# Patient Record
Sex: Male | Born: 1965 | Race: Black or African American | Hispanic: No | Marital: Married | Smoking: Never smoker
Health system: Southern US, Community
[De-identification: ages and names within clinical notes are randomized; demographics above are authoritative.]

## PROBLEM LIST (undated history)

## (undated) DIAGNOSIS — I10 Essential (primary) hypertension: Secondary | ICD-10-CM

## (undated) HISTORY — PX: BRAIN SURGERY: SHX531

---

## 2008-02-10 ENCOUNTER — Emergency Department: Payer: Self-pay | Admitting: Emergency Medicine

## 2009-05-31 ENCOUNTER — Ambulatory Visit: Payer: Self-pay | Admitting: *Deleted

## 2010-04-11 ENCOUNTER — Ambulatory Visit: Payer: Self-pay | Admitting: Internal Medicine

## 2010-10-23 ENCOUNTER — Ambulatory Visit: Payer: Self-pay | Admitting: Internal Medicine

## 2013-08-19 ENCOUNTER — Ambulatory Visit: Payer: Self-pay | Admitting: Internal Medicine

## 2015-05-05 ENCOUNTER — Ambulatory Visit
Admission: RE | Admit: 2015-05-05 | Discharge: 2015-05-05 | Disposition: A | Discharge status: Home / Self Care | Payer: BLUE CROSS/BLUE SHIELD | Source: Ambulatory Visit | Attending: Internal Medicine | Admitting: Internal Medicine

## 2015-05-05 ENCOUNTER — Other Ambulatory Visit: Payer: Self-pay | Admitting: Internal Medicine

## 2015-05-05 DIAGNOSIS — R05 Cough: Secondary | ICD-10-CM | POA: Diagnosis present

## 2015-05-05 DIAGNOSIS — R059 Cough, unspecified: Secondary | ICD-10-CM

## 2017-03-27 ENCOUNTER — Encounter: Payer: Self-pay | Admitting: General Surgery

## 2017-04-01 ENCOUNTER — Encounter: Payer: Self-pay | Admitting: *Deleted

## 2017-04-07 ENCOUNTER — Ambulatory Visit: Payer: Self-pay | Admitting: General Surgery

## 2017-04-08 ENCOUNTER — Encounter: Payer: Self-pay | Admitting: *Deleted

## 2020-09-25 DIAGNOSIS — R030 Elevated blood-pressure reading, without diagnosis of hypertension: Secondary | ICD-10-CM | POA: Diagnosis not present

## 2020-09-25 DIAGNOSIS — Z125 Encounter for screening for malignant neoplasm of prostate: Secondary | ICD-10-CM | POA: Diagnosis not present

## 2020-09-25 DIAGNOSIS — Z23 Encounter for immunization: Secondary | ICD-10-CM | POA: Diagnosis not present

## 2020-10-05 DIAGNOSIS — I10 Essential (primary) hypertension: Secondary | ICD-10-CM | POA: Diagnosis not present

## 2020-11-16 DIAGNOSIS — I10 Essential (primary) hypertension: Secondary | ICD-10-CM | POA: Diagnosis not present

## 2020-12-07 DIAGNOSIS — Z20822 Contact with and (suspected) exposure to covid-19: Secondary | ICD-10-CM | POA: Diagnosis not present

## 2021-02-26 DIAGNOSIS — M9903 Segmental and somatic dysfunction of lumbar region: Secondary | ICD-10-CM | POA: Diagnosis not present

## 2021-02-26 DIAGNOSIS — M5431 Sciatica, right side: Secondary | ICD-10-CM | POA: Diagnosis not present

## 2021-02-26 DIAGNOSIS — M9905 Segmental and somatic dysfunction of pelvic region: Secondary | ICD-10-CM | POA: Diagnosis not present

## 2021-02-26 DIAGNOSIS — M9904 Segmental and somatic dysfunction of sacral region: Secondary | ICD-10-CM | POA: Diagnosis not present

## 2021-02-28 DIAGNOSIS — M25472 Effusion, left ankle: Secondary | ICD-10-CM | POA: Diagnosis not present

## 2021-02-28 DIAGNOSIS — M9904 Segmental and somatic dysfunction of sacral region: Secondary | ICD-10-CM | POA: Diagnosis not present

## 2021-02-28 DIAGNOSIS — M9903 Segmental and somatic dysfunction of lumbar region: Secondary | ICD-10-CM | POA: Diagnosis not present

## 2021-02-28 DIAGNOSIS — M5431 Sciatica, right side: Secondary | ICD-10-CM | POA: Diagnosis not present

## 2021-03-05 DIAGNOSIS — M25472 Effusion, left ankle: Secondary | ICD-10-CM | POA: Diagnosis not present

## 2021-03-05 DIAGNOSIS — M9903 Segmental and somatic dysfunction of lumbar region: Secondary | ICD-10-CM | POA: Diagnosis not present

## 2021-03-05 DIAGNOSIS — M9904 Segmental and somatic dysfunction of sacral region: Secondary | ICD-10-CM | POA: Diagnosis not present

## 2021-03-05 DIAGNOSIS — M5431 Sciatica, right side: Secondary | ICD-10-CM | POA: Diagnosis not present

## 2021-03-07 DIAGNOSIS — M9904 Segmental and somatic dysfunction of sacral region: Secondary | ICD-10-CM | POA: Diagnosis not present

## 2021-03-07 DIAGNOSIS — M9903 Segmental and somatic dysfunction of lumbar region: Secondary | ICD-10-CM | POA: Diagnosis not present

## 2021-03-07 DIAGNOSIS — M25472 Effusion, left ankle: Secondary | ICD-10-CM | POA: Diagnosis not present

## 2021-03-07 DIAGNOSIS — M5431 Sciatica, right side: Secondary | ICD-10-CM | POA: Diagnosis not present

## 2021-03-14 DIAGNOSIS — M9904 Segmental and somatic dysfunction of sacral region: Secondary | ICD-10-CM | POA: Diagnosis not present

## 2021-03-14 DIAGNOSIS — M5431 Sciatica, right side: Secondary | ICD-10-CM | POA: Diagnosis not present

## 2021-03-14 DIAGNOSIS — M9903 Segmental and somatic dysfunction of lumbar region: Secondary | ICD-10-CM | POA: Diagnosis not present

## 2021-03-14 DIAGNOSIS — M25472 Effusion, left ankle: Secondary | ICD-10-CM | POA: Diagnosis not present

## 2021-03-21 DIAGNOSIS — M545 Low back pain, unspecified: Secondary | ICD-10-CM | POA: Diagnosis not present

## 2021-06-30 ENCOUNTER — Emergency Department
Admission: EM | Admit: 2021-06-30 | Discharge: 2021-07-01 | Disposition: A | Discharge status: Home / Self Care | Payer: BC Managed Care – PPO | Attending: Emergency Medicine | Admitting: Emergency Medicine

## 2021-06-30 ENCOUNTER — Other Ambulatory Visit: Payer: Self-pay

## 2021-06-30 ENCOUNTER — Encounter: Payer: Self-pay | Admitting: Emergency Medicine

## 2021-06-30 ENCOUNTER — Emergency Department: Payer: BC Managed Care – PPO

## 2021-06-30 DIAGNOSIS — R059 Cough, unspecified: Secondary | ICD-10-CM | POA: Diagnosis not present

## 2021-06-30 DIAGNOSIS — J209 Acute bronchitis, unspecified: Secondary | ICD-10-CM | POA: Insufficient documentation

## 2021-06-30 DIAGNOSIS — R0789 Other chest pain: Secondary | ICD-10-CM | POA: Insufficient documentation

## 2021-06-30 DIAGNOSIS — R062 Wheezing: Secondary | ICD-10-CM | POA: Diagnosis not present

## 2021-06-30 DIAGNOSIS — R0602 Shortness of breath: Secondary | ICD-10-CM | POA: Diagnosis not present

## 2021-06-30 DIAGNOSIS — Z87891 Personal history of nicotine dependence: Secondary | ICD-10-CM | POA: Insufficient documentation

## 2021-06-30 DIAGNOSIS — I10 Essential (primary) hypertension: Secondary | ICD-10-CM | POA: Diagnosis not present

## 2021-06-30 HISTORY — DX: Essential (primary) hypertension: I10

## 2021-06-30 LAB — BASIC METABOLIC PANEL
Anion gap: 7 (ref 5–15)
BUN: 13 mg/dL (ref 6–20)
CO2: 28 mmol/L (ref 22–32)
Calcium: 9.6 mg/dL (ref 8.9–10.3)
Chloride: 105 mmol/L (ref 98–111)
Creatinine, Ser: 1.43 mg/dL — ABNORMAL HIGH (ref 0.61–1.24)
GFR, Estimated: 58 mL/min — ABNORMAL LOW (ref >60)
Glucose, Bld: 100 mg/dL — ABNORMAL HIGH (ref 70–99)
Potassium: 3.5 mmol/L (ref 3.5–5.1)
Sodium: 140 mmol/L (ref 135–145)

## 2021-06-30 LAB — TROPONIN I (HIGH SENSITIVITY)
Troponin I (High Sensitivity): 5 ng/L (ref <18)
Troponin I (High Sensitivity): 5 ng/L (ref <18)

## 2021-06-30 LAB — CBC
HCT: 40.8 % (ref 39.0–52.0)
Hemoglobin: 14 g/dL (ref 13.0–17.0)
MCH: 27.8 pg (ref 26.0–34.0)
MCHC: 34.3 g/dL (ref 30.0–36.0)
MCV: 81 fL (ref 80.0–100.0)
Platelets: 228 10*3/uL (ref 150–400)
RBC: 5.04 MIL/uL (ref 4.22–5.81)
RDW: 13.1 % (ref 11.5–15.5)
WBC: 3.9 10*3/uL — ABNORMAL LOW (ref 4.0–10.5)
nRBC: 0 % (ref 0.0–0.2)

## 2021-06-30 NOTE — ED Triage Notes (Signed)
Pt via POV from home. Pt c/o chest tightness, SOB, and productive cough for 2 weaks. Pt has been exposed to COVID. Pt is states "when I cough or take a deep breath, everything hurts" Pt is A&Ox4 and NAD.

## 2021-07-01 LAB — HEPATIC FUNCTION PANEL
ALT: 32 U/L (ref 0–44)
AST: 26 U/L (ref 15–41)
Albumin: 4.5 g/dL (ref 3.5–5.0)
Alkaline Phosphatase: 49 U/L (ref 38–126)
Bilirubin, Direct: <0.1 mg/dL (ref 0.0–0.2)
Total Bilirubin: 0.8 mg/dL (ref 0.3–1.2)
Total Protein: 7.3 g/dL (ref 6.5–8.1)

## 2021-07-01 LAB — LIPASE, BLOOD: Lipase: 50 U/L (ref 11–51)

## 2021-07-01 MED ORDER — PREDNISONE 20 MG PO TABS: 40.0000 mg | ORAL_TABLET | Freq: Every day | ORAL | 0 refills | Status: AC

## 2021-07-01 MED ORDER — NAPROXEN 500 MG PO TABS
500.0000 mg | ORAL_TABLET | Freq: Once | ORAL | Status: AC
  Administered 2021-07-01: 500 mg via ORAL
  Filled 2021-07-01: qty 1

## 2021-07-01 MED ORDER — FAMOTIDINE 20 MG PO TABS: 20.0000 mg | ORAL_TABLET | Freq: Two times a day (BID) | ORAL | 0 refills | Status: DC

## 2021-07-01 MED ORDER — IPRATROPIUM-ALBUTEROL 0.5-2.5 (3) MG/3ML IN SOLN
3.0000 mL | Freq: Once | RESPIRATORY_TRACT | Status: AC
  Administered 2021-07-01: 3 mL via RESPIRATORY_TRACT
  Filled 2021-07-01: qty 3

## 2021-07-01 MED ORDER — PREDNISONE 20 MG PO TABS
60.0000 mg | ORAL_TABLET | Freq: Once | ORAL | Status: AC
  Administered 2021-07-01: 60 mg via ORAL
  Filled 2021-07-01: qty 3

## 2021-07-01 MED ORDER — NAPROXEN 500 MG PO TABS: 500.0000 mg | ORAL_TABLET | Freq: Two times a day (BID) | ORAL | 0 refills | Status: DC

## 2021-07-01 MED ORDER — FAMOTIDINE 20 MG PO TABS
40.0000 mg | ORAL_TABLET | Freq: Once | ORAL | Status: AC
  Administered 2021-07-01: 40 mg via ORAL
  Filled 2021-07-01: qty 2

## 2021-07-01 MED ORDER — ALBUTEROL SULFATE HFA 108 (90 BASE) MCG/ACT IN AERS: 2.0000 | INHALATION_SPRAY | RESPIRATORY_TRACT | 0 refills | Status: DC | PRN

## 2021-07-01 NOTE — ED Provider Notes (Signed)
[facility] Emergency Department Provider Note  ____________________________________________  Time seen: Approximately 2:17 AM  I have reviewed the triage vital signs and the nursing notes.   HISTORY  Chief Complaint Chest Pain and Shortness of Breath    HPI DARTANYON FRANKOWSKI is a 55 y.o. male with a history of hypertension who comes ED complaining of wheezing cough and chest tightness for the past week.  Patient reports that over the last 2 weeks has been having multiple symptoms of viral illness including chills, body aches, fatigue.  More recently started having  nonproductive cough, wheezing, chest tightness with movement and coughing.  He notes multiple of his coworkers recently have had COVID.  Chest tightness is intermittent.  Not pleuritic.  No vomiting or diarrhea.  Tolerating oral intake.  Symptoms are waxing waning, no aggravating or alleviating factors.  Mild to moderate intensity.  Comes the ED tonight due to feeling like symptoms have  just gone on too long    Past Medical History:  Diagnosis Date   Hypertension      There are no problems to display for this patient.       Prior to Admission medications   Medication Sig Start Date End Date Taking? Authorizing Provider  albuterol (PROVENTIL HFA) 108 (90 Base) MCG/ACT inhaler Inhale 2 puffs into the lungs every 4 (four) hours as needed for wheezing or shortness of breath. 07/01/21  Yes Sharman Cheek, MD  famotidine (PEPCID) 20 MG tablet Take 1 tablet (20 mg total) by mouth 2 (two) times daily. 07/01/21  Yes Sharman Cheek, MD  naproxen (NAPROSYN) 500 MG tablet Take 1 tablet (500 mg total) by mouth 2 (two) times daily with a meal. 07/01/21  Yes Sharman Cheek, MD  predniSONE (DELTASONE) 20 MG tablet Take 2 tablets (40 mg total) by mouth daily with breakfast for 4 days. 07/01/21 07/05/21 Yes Sharman Cheek, MD     Allergies Hydrocodone   No family history on file.  Social  History Social History   Tobacco Use   Smoking status: Former    Types: Cigarettes   Smokeless tobacco: Never  Substance Use Topics   Alcohol use: Yes   Drug use: Never    Review of Systems  Constitutional:   No fever positive chills.  ENT:   Positive sore throat. No rhinorrhea. Cardiovascular:   Positive chest tightness without palpitations or syncope. Respiratory:   No dyspnea positive nonproductive cough. Gastrointestinal:   Negative for abdominal pain, vomiting and diarrhea.  Musculoskeletal:   Negative for focal pain or swelling All other systems reviewed and are negative except as documented above in ROS and HPI.  ____________________________________________   PHYSICAL EXAM:  VITAL SIGNS: ED Triage Vitals [06/30/21 1833]  Enc Vitals Group     BP 138/84     Pulse Rate 78     Resp 20     Temp 98.3 F (36.8 C)     Temp Source Oral     SpO2 100 %     Weight 192 lb (87.1 kg)     Height 5\' 7"  (1.702 m)     Head Circumference      Peak Flow      Pain Score 8     Pain Loc      Pain Edu?      Excl. in GC?     Vital signs reviewed, nursing assessments reviewed.   Constitutional:   Alert and oriented. Non-toxic appearance. Eyes:   Conjunctivae are normal. EOMI.  PERRL. ENT      Head:   Normocephalic and atraumatic.      Nose:   Normal.      Mouth/Throat:   Normal, moist mucosa.      Neck:   No meningismus. Full ROM. Hematological/Lymphatic/Immunilogical:   No cervical lymphadenopathy. Cardiovascular:   RRR. Symmetric bilateral radial and DP pulses.  No murmurs. Cap refill less than 2 seconds. Respiratory:   Normal respiratory effort without tachypnea/retractions.  Diffuse expiratory wheezing.  Prolonged expiratory phase with coughing. Gastrointestinal:   Soft and nontender. Non distended. There is no CVA tenderness.  No rebound, rigidity, or guarding. Genitourinary:   deferred Musculoskeletal:   Normal range of motion in all extremities. No joint effusions.  No  lower extremity tenderness.  No edema. Neurologic:   Normal speech and language.  Motor grossly intact. No acute focal neurologic deficits are appreciated.  Skin:    Skin is warm, dry and intact. No rash noted.  No petechiae, purpura, or bullae.  ____________________________________________    LABS (pertinent positives/negatives) (all labs ordered are listed, but only abnormal results are displayed) Labs Reviewed  BASIC METABOLIC PANEL - Abnormal; Notable for the following components:      Result Value   Glucose, Bld 100 (*)    Creatinine, Ser 1.43 (*)    GFR, Estimated 58 (*)    All other components within normal limits  CBC - Abnormal; Notable for the following components:   WBC 3.9 (*)    All other components within normal limits  LIPASE, BLOOD  HEPATIC FUNCTION PANEL  TROPONIN I (HIGH SENSITIVITY)  TROPONIN I (HIGH SENSITIVITY)   ____________________________________________   EKG Interpreted by me Normal sinus rhythm rate of 69, normal axis and intervals.  Poor R wave progression.  Normal ST segments.  Isolated T wave inversion in lead III which is nonspecific.  No evidence of right heart strain.   ____________________________________________    RADIOLOGY  DG Chest 2 View  Result Date: 06/30/2021 CLINICAL DATA:  Chest tightness, short of breath, productive cough for 2 weeks EXAM: CHEST - 2 VIEW COMPARISON:  04/25/2015 FINDINGS: Frontal and lateral views of the chest demonstrate an unremarkable cardiac silhouette. No acute airspace disease, effusion, or pneumothorax. No acute bony abnormalities. IMPRESSION: 1. No acute intrathoracic process. Electronically Signed   By: Sharlet Salina M.D.   On: 06/30/2021 18:57    ____________________________________________   PROCEDURES Procedures  ____________________________________________  DIFFERENTIAL DIAGNOSIS   Pneumonia, pneumothorax, acute bronchitis, pleural effusion, pulmonary edema  CLINICAL IMPRESSION /  ASSESSMENT AND PLAN / ED COURSE  Medications ordered in the ED: Medications  predniSONE (DELTASONE) tablet 60 mg (60 mg Oral Given 07/01/21 0019)  ipratropium-albuterol (DUONEB) 0.5-2.5 (3) MG/3ML nebulizer solution 3 mL (3 mLs Nebulization Given 07/01/21 0020)  naproxen (NAPROSYN) tablet 500 mg (500 mg Oral Given 07/01/21 0020)  famotidine (PEPCID) tablet 40 mg (40 mg Oral Given 07/01/21 0020)    Pertinent labs & imaging results that were available during my care of the patient were reviewed by me and considered in my medical decision making (see chart for details).  ANDRES VEST was evaluated in Emergency Department on 07/01/2021 for the symptoms described in the history of present illness. He was evaluated in the context of the global COVID-19 pandemic, which necessitated consideration that the patient might be at risk for infection with the SARS-CoV-2 virus that causes COVID-19. Institutional protocols and algorithms that pertain to the evaluation of patients at risk for COVID-19 are in a  state of rapid change based on information released by regulatory bodies including the CDC and federal and state organizations. These policies and algorithms were followed during the patient's care in the ED.   Patient presents with multiple symptoms of viral illness, now clinically has acute bronchitis. Considering the patient's symptoms, medical history, and physical examination today, I have low suspicion for ACS, PE, TAD, pneumothorax, carditis, mediastinitis, pneumonia, CHF, or sepsis.  Will treat with prednisone, albuterol, Pepcid, naproxen.  At this point with about 14 days of symptoms, COVID/flu testing is not clinically useful since patient would be outside of quarantine.,  Would not benefit from therapeutics.      ____________________________________________   FINAL CLINICAL IMPRESSION(S) / ED DIAGNOSES    Final diagnoses:  Acute bronchitis, unspecified organism     ED Discharge Orders           Ordered    predniSONE (DELTASONE) 20 MG tablet  Daily with breakfast        07/01/21 0217    albuterol (PROVENTIL HFA) 108 (90 Base) MCG/ACT inhaler  Every 4 hours PRN        07/01/21 0217    famotidine (PEPCID) 20 MG tablet  2 times daily        07/01/21 0217    naproxen (NAPROSYN) 500 MG tablet  2 times daily with meals        07/01/21 0217            Portions of this note were generated with dragon dictation software. Dictation errors may occur despite best attempts at proofreading.    Sharman Cheek, MD 07/01/21 Cleophas Dunker

## 2022-02-01 IMAGING — CR DG CHEST 2V
2 series · 2 of 2 positions shown · non-contrast
Comparison: 04/25/2015

CLINICAL DATA: Chest tightness, short of breath, productive cough
for 2 weeks

EXAM:
CHEST - 2 VIEW

[chest pa]
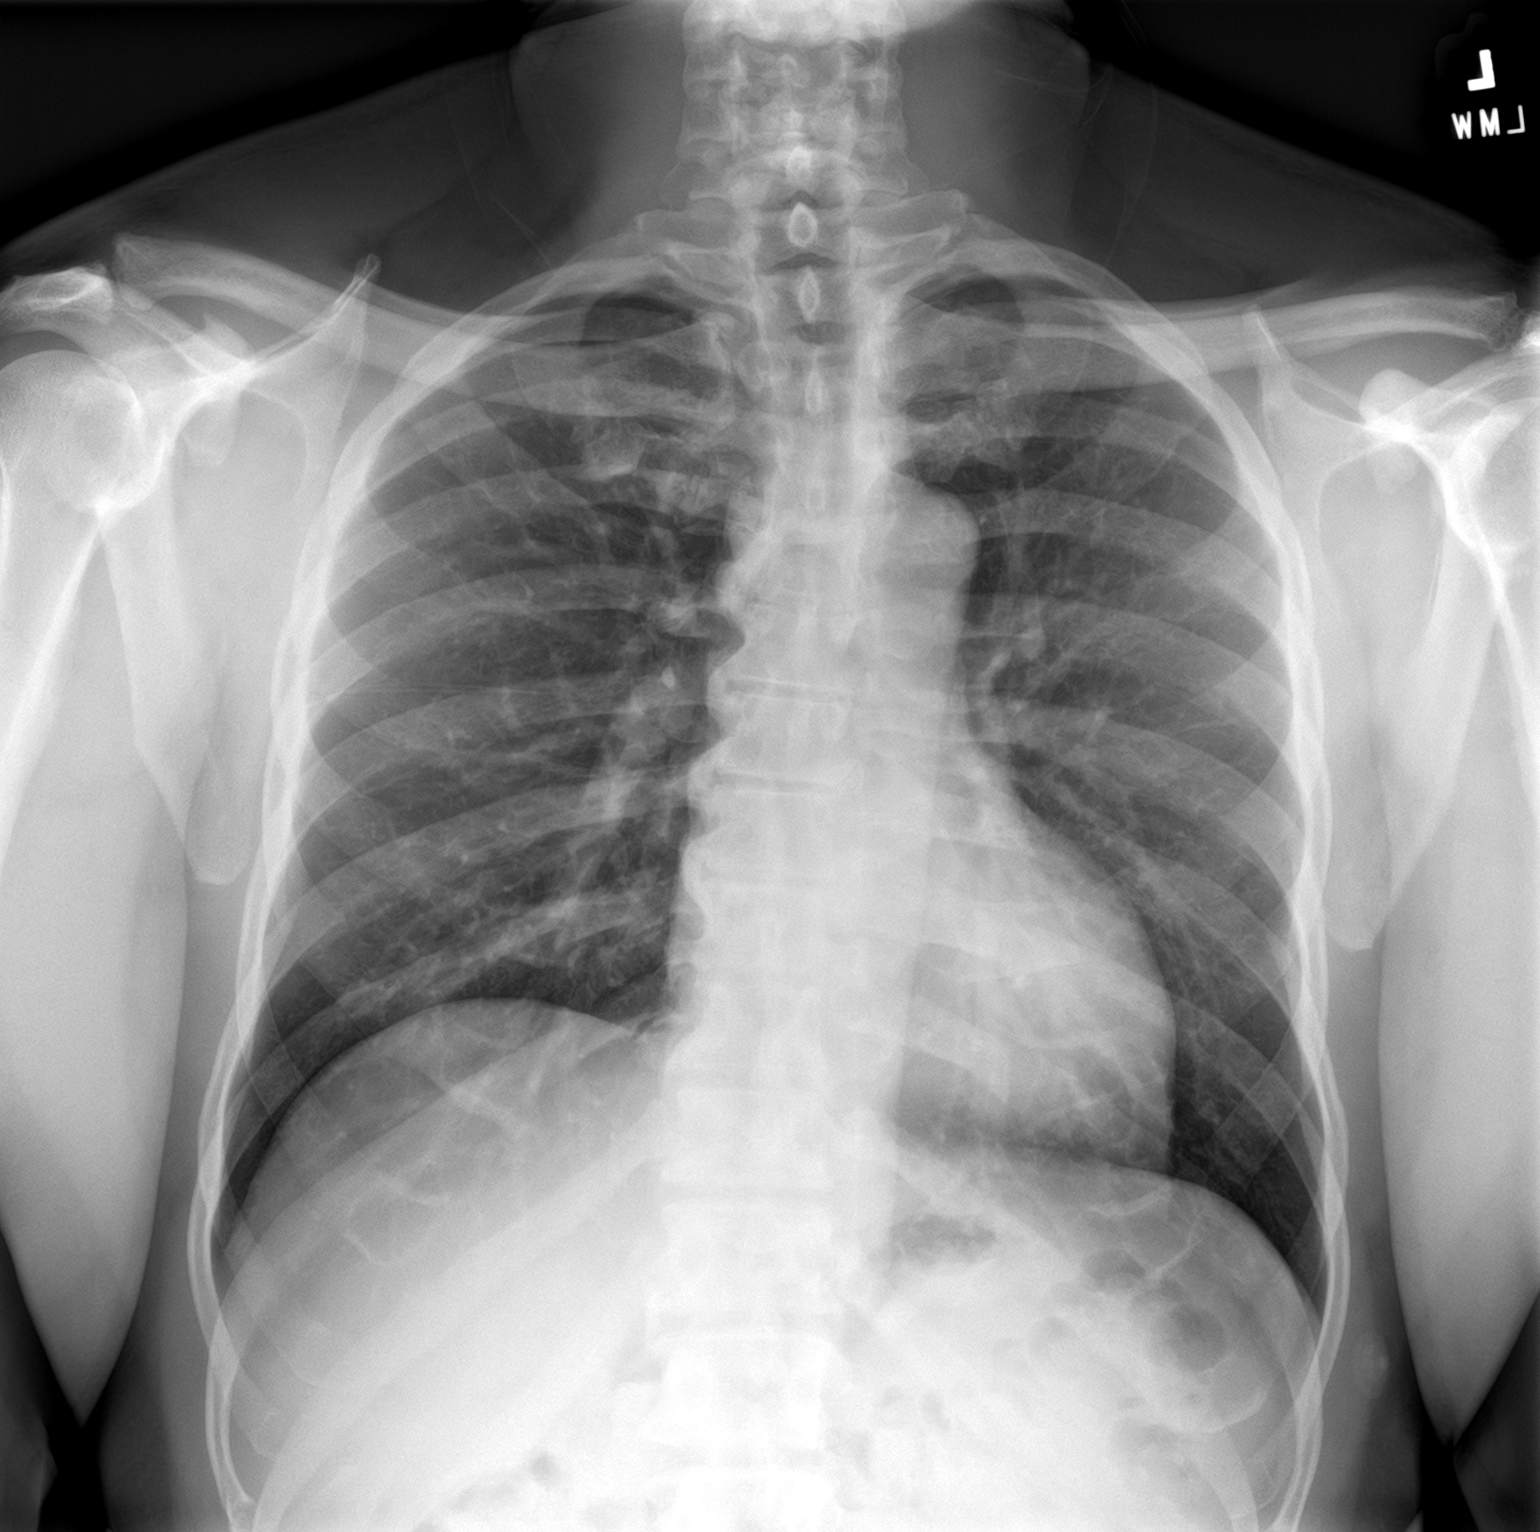

[chest lat]
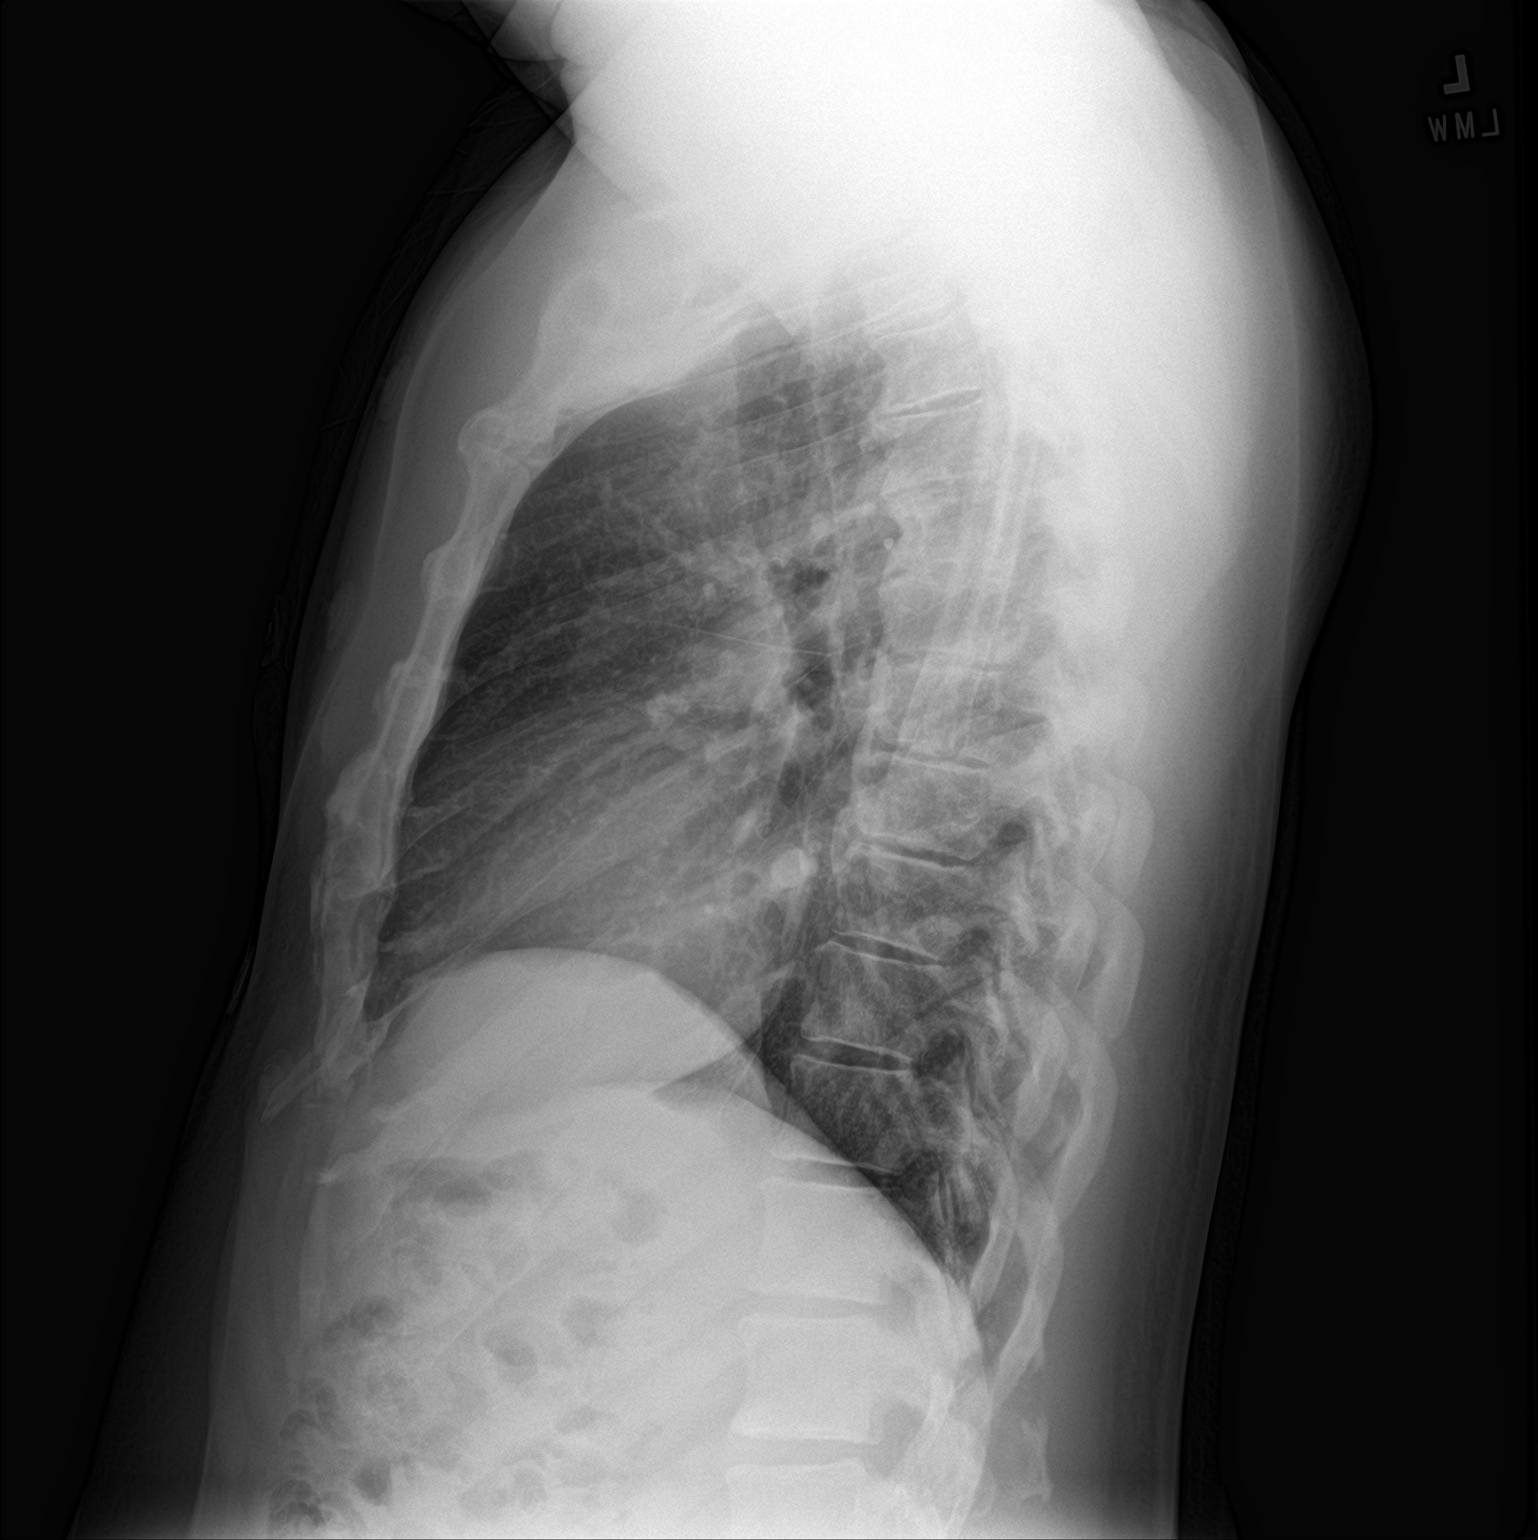

[2 of 2 positions shown; findings below may reference images not displayed]

FINDINGS: Frontal and lateral views of the chest demonstrate an unremarkable
cardiac silhouette. No acute airspace disease, effusion, or
pneumothorax. No acute bony abnormalities.
IMPRESSION: 1. No acute intrathoracic process.

## 2022-07-18 ENCOUNTER — Ambulatory Visit (INDEPENDENT_AMBULATORY_CARE_PROVIDER_SITE_OTHER): Payer: BC Managed Care – PPO | Admitting: Nurse Practitioner

## 2022-07-18 ENCOUNTER — Encounter: Payer: Self-pay | Admitting: Nurse Practitioner

## 2022-07-18 VITALS — BP 152/83 | HR 59 | Ht 67.0 in | Wt 187.8 lb

## 2022-07-18 DIAGNOSIS — R5383 Other fatigue: Secondary | ICD-10-CM | POA: Diagnosis not present

## 2022-07-18 DIAGNOSIS — Z7689 Persons encountering health services in other specified circumstances: Secondary | ICD-10-CM | POA: Diagnosis not present

## 2022-07-18 DIAGNOSIS — I10 Essential (primary) hypertension: Secondary | ICD-10-CM

## 2022-07-18 DIAGNOSIS — E663 Overweight: Secondary | ICD-10-CM | POA: Diagnosis not present

## 2022-07-18 MED ORDER — LISINOPRIL-HYDROCHLOROTHIAZIDE 10-12.5 MG PO TABS: 1.0000 | ORAL_TABLET | Freq: Every day | ORAL | 2 refills | Status: DC

## 2022-07-18 NOTE — Assessment & Plan Note (Signed)
Labs ordered.

## 2022-07-18 NOTE — Assessment & Plan Note (Signed)
Blood pressure 152/83 in the office today. Refilled lisinopril-HCTZ 10-12.5 mg. Advised patient to check blood pressure twice a week and bring the readings during next office visit. If needed would adjust the medication.

## 2022-07-18 NOTE — Progress Notes (Signed)
New Patient Office Visit  Subjective    Patient ID: Shawn Sparks, male    DOB: 25-Sep-1966  Age: 56 y.o. MRN: [id number]  CC:  Chief Complaint  Patient presents with   New Patient (Initial Visit)    Patient here today to establish care and has hx of hypertension. Patient has been w/o med for over a month.     HPI Shawn Sparks presents to establish care.  His previous PCP was Dr. Arlana Pouch at [place]. He has h/o hypertension and was taking lisinopril-HCTZ 10.12.5 mg. He states that he was out of medication and has not take BP medication since a month.    Outpatient Encounter Medications as of 07/18/2022  Medication Sig   [DISCONTINUED] lisinopril-hydrochlorothiazide (ZESTORETIC) 10-12.5 MG tablet Take 1 tablet by mouth daily.   lisinopril-hydrochlorothiazide (ZESTORETIC) 10-12.5 MG tablet Take 1 tablet by mouth daily.   [DISCONTINUED] albuterol (PROVENTIL HFA) 108 (90 Base) MCG/ACT inhaler Inhale 2 puffs into the lungs every 4 (four) hours as needed for wheezing or shortness of breath.   [DISCONTINUED] famotidine (PEPCID) 20 MG tablet Take 1 tablet (20 mg total) by mouth 2 (two) times daily.   [DISCONTINUED] naproxen (NAPROSYN) 500 MG tablet Take 1 tablet (500 mg total) by mouth 2 (two) times daily with a meal.   No facility-administered encounter medications on file as of 07/18/2022.    Past Medical History:  Diagnosis Date   Hypertension     Past Surgical History:  Procedure Laterality Date   BRAIN SURGERY     as infant    Family History  Problem Relation Age of Onset   Hypertension Mother    Hyperlipidemia Mother    Diabetes Mother    Hypertension Father    Hyperlipidemia Father    Diabetes Father     Social History   Socioeconomic History   Marital status: Married    Spouse name: Not on file   Number of children: Not on file   Years of education: Not on file   Highest education level: Not on file  Occupational History   Not on file  Tobacco Use   Smoking  status: Never   Smokeless tobacco: Never  Vaping Use   Vaping Use: Never used  Substance and Sexual Activity   Alcohol use: Yes    Comment: occasionaly   Drug use: Never   Sexual activity: Not Currently  Other Topics Concern   Not on file  Social History Narrative   Not on file   Social Determinants of Health   Financial Resource Strain: Not on file  Food Insecurity: Not on file  Transportation Needs: Not on file  Physical Activity: Not on file  Stress: Not on file  Social Connections: Not on file  Intimate Partner Violence: Not on file    Review of Systems  Constitutional:  Positive for malaise/fatigue. Negative for diaphoresis, fever and weight loss.  HENT:  Negative for ear discharge, hearing loss and sore throat.   Eyes:  Negative for blurred vision, photophobia and redness.  Respiratory:  Negative for cough and shortness of breath.   Cardiovascular:  Negative for chest pain and leg swelling.  Gastrointestinal:  Negative for abdominal pain, blood in stool and heartburn.  Genitourinary:  Negative for dysuria, frequency and hematuria.  Musculoskeletal:  Negative for back pain and myalgias.  Neurological:  Negative for dizziness, tingling and headaches.  Psychiatric/Behavioral:  Negative for depression, substance abuse and suicidal ideas.  Objective    BP (!) 152/83   Pulse (!) 59   Ht 5\' 7"  (1.702 m)   Wt 187 lb 12.8 oz (85.2 kg)   BMI 29.41 kg/m   Physical Exam Constitutional:      Appearance: Normal appearance. He is normal weight.  HENT:     Head: Normocephalic.     Right Ear: Tympanic membrane normal.     Left Ear: Tympanic membrane normal.     Nose: Nose normal.     Mouth/Throat:     Mouth: Mucous membranes are moist.     Pharynx: Oropharynx is clear.  Eyes:     Extraocular Movements: Extraocular movements intact.     Conjunctiva/sclera: Conjunctivae normal.     Pupils: Pupils are equal, round, and reactive to light.  Cardiovascular:      Rate and Rhythm: Normal rate and regular rhythm.     Pulses: Normal pulses.     Heart sounds: Normal heart sounds.  Pulmonary:     Effort: Pulmonary effort is normal.     Breath sounds: Normal breath sounds.  Abdominal:     General: Abdomen is flat. Bowel sounds are normal.     Palpations: Abdomen is soft. There is no mass.     Tenderness: There is no abdominal tenderness.  Musculoskeletal:        General: No swelling or tenderness.     Cervical back: Normal range of motion.  Skin:    General: Skin is warm.     Capillary Refill: Capillary refill takes less than 2 seconds.     Coloration: Skin is not jaundiced.  Neurological:     General: No focal deficit present.     Mental Status: He is alert and oriented to person, place, and time. Mental status is at baseline.  Psychiatric:        Mood and Affect: Mood normal.        Behavior: Behavior normal.        Thought Content: Thought content normal.        Judgment: Judgment normal.         Assessment & Plan:   Problem List Items Addressed This Visit       Cardiovascular and Mediastinum   Hypertension - Primary    Blood pressure 152/83 in the office today. Refilled lisinopril-HCTZ 10-12.5 mg. Advised patient to check blood pressure twice a week and bring the readings during next office visit. If needed would adjust the medication.      Relevant Medications   lisinopril-hydrochlorothiazide (ZESTORETIC) 10-12.5 MG tablet   Other Relevant Orders   COMPLETE METABOLIC PANEL WITH GFR   Lipid panel   PSA   TSH   CBC with Differential/Platelet     Other   Overweight    Body mass index is 29.41 kg/m. Advised patient to lose weight. Encourage patient to consume balanced diet and follow regular exercise routine.      Encounter to establish care    Care established.      Other fatigue    Labs ordered      Relevant Orders   COMPLETE METABOLIC PANEL WITH GFR   Lipid panel   PSA   TSH   CBC with  Differential/Platelet    Return in about 4 weeks (around 08/15/2022) for hypertension.   08/17/2022, NP

## 2022-07-18 NOTE — Assessment & Plan Note (Signed)
Care established. ? ?

## 2022-07-18 NOTE — Assessment & Plan Note (Signed)
Body mass index is 29.41 kg/m. Advised patient to lose weight. Encourage patient to consume balanced diet and follow regular exercise routine.

## 2022-07-19 ENCOUNTER — Ambulatory Visit (INDEPENDENT_AMBULATORY_CARE_PROVIDER_SITE_OTHER): Payer: BC Managed Care – PPO

## 2022-07-19 ENCOUNTER — Other Ambulatory Visit: Payer: Self-pay | Admitting: Nurse Practitioner

## 2022-07-19 DIAGNOSIS — Z0000 Encounter for general adult medical examination without abnormal findings: Secondary | ICD-10-CM | POA: Diagnosis not present

## 2022-07-19 DIAGNOSIS — R5383 Other fatigue: Secondary | ICD-10-CM | POA: Diagnosis not present

## 2022-07-19 DIAGNOSIS — I10 Essential (primary) hypertension: Secondary | ICD-10-CM | POA: Diagnosis not present

## 2022-07-24 LAB — CBC/DIFF AMBIGUOUS DEFAULT
Basophils Absolute: 0 10*3/uL (ref 0.0–0.2)
Basos: 1 %
EOS (ABSOLUTE): 0.3 10*3/uL (ref 0.0–0.4)
Eos: 8 %
Hematocrit: 47.3 % (ref 37.5–51.0)
Hemoglobin: 15.3 g/dL (ref 13.0–17.7)
Immature Grans (Abs): 0 10*3/uL (ref 0.0–0.1)
Immature Granulocytes: 0 %
Lymphocytes Absolute: 1.4 10*3/uL (ref 0.7–3.1)
Lymphs: 39 %
MCH: 27.1 pg (ref 26.6–33.0)
MCHC: 32.3 g/dL (ref 31.5–35.7)
MCV: 84 fL (ref 79–97)
Monocytes Absolute: 0.4 10*3/uL (ref 0.1–0.9)
Monocytes: 11 %
Neutrophils Absolute: 1.4 10*3/uL (ref 1.4–7.0)
Neutrophils: 41 %
Platelets: 182 10*3/uL (ref 150–450)
RBC: 5.65 x10E6/uL (ref 4.14–5.80)
RDW: 13.3 % (ref 11.6–15.4)
WBC: 3.5 10*3/uL (ref 3.4–10.8)

## 2022-07-24 LAB — CMP14+EGFR
ALT: 15 IU/L (ref 0–44)
AST: 17 IU/L (ref 0–40)
Albumin/Globulin Ratio: 1.6 (ref 1.2–2.2)
Albumin: 4.4 g/dL (ref 3.8–4.9)
Alkaline Phosphatase: 58 IU/L (ref 44–121)
BUN/Creatinine Ratio: 8 — ABNORMAL LOW (ref 9–20)
BUN: 10 mg/dL (ref 6–24)
Bilirubin Total: 0.2 mg/dL (ref 0.0–1.2)
CO2: 24 mmol/L (ref 20–29)
Calcium: 9.8 mg/dL (ref 8.7–10.2)
Chloride: 101 mmol/L (ref 96–106)
Creatinine, Ser: 1.33 mg/dL — ABNORMAL HIGH (ref 0.76–1.27)
Globulin, Total: 2.7 g/dL (ref 1.5–4.5)
Glucose: 92 mg/dL (ref 70–99)
Potassium: 4.7 mmol/L (ref 3.5–5.2)
Sodium: 141 mmol/L (ref 134–144)
Total Protein: 7.1 g/dL (ref 6.0–8.5)
eGFR: 63 mL/min/{1.73_m2} (ref >59)

## 2022-07-24 LAB — TSH: TSH: 2.46 u[IU]/mL (ref 0.450–4.500)

## 2022-07-24 LAB — SPECIMEN STATUS REPORT

## 2022-07-24 LAB — LIPID PANEL W/O CHOL/HDL RATIO
Cholesterol, Total: 232 mg/dL — ABNORMAL HIGH (ref 100–199)
HDL: 50 mg/dL (ref >39)
LDL Chol Calc (NIH): 162 mg/dL — ABNORMAL HIGH (ref 0–99)
Triglycerides: 113 mg/dL (ref 0–149)
VLDL Cholesterol Cal: 20 mg/dL (ref 5–40)

## 2022-07-24 LAB — PSA: Prostate Specific Ag, Serum: 2.5 ng/mL (ref 0.0–4.0)

## 2022-08-02 ENCOUNTER — Telehealth: Payer: Self-pay | Admitting: Pharmacist

## 2022-08-02 NOTE — Telephone Encounter (Signed)
   08/02/2022 Name: Shawn Sparks MRN: [id number] DOB: Aug 23, 1966   Patient was referred to the pharmacist by their PCP for assistance in managing hypertension.  Patient appearing on report for [employer] [facility] - Hypertension Control report due to last documented ambulatory blood pressure of 152/83 on 07/18/2022. Next appointment with PCP is 08/22/2022.   Outreached patient to discuss hypertension control and medication management. Was unable to reach patient via telephone and unable to leave a message as no voicemail is setup.    Follow Up Plan: CM Pharmacist will outreach to patient by telephone again within the next 30 days  Estelle Grumbles, PharmD, [facility] [phone]

## 2022-08-21 ENCOUNTER — Telehealth: Payer: Self-pay | Admitting: Pharmacist

## 2022-08-21 NOTE — Progress Notes (Signed)
   08/21/2022  Patient ID: Shawn Sparks, male   DOB: 1966/10/20, 56 y.o.   MRN: [id number]  Patient was referred to the pharmacist by their PCP for assistance in managing hypertension.  Patient appearing on report for [employer] [facility] - Hypertension Control report due to last documented ambulatory blood pressure of 152/83 on 07/18/2022. Next appointment with PCP is 08/22/2022.   Outreached patient to discuss hypertension control and medication management. Was unable to reach patient via telephone and unable to leave a message as no voicemail is setup. Outreach attempt #2.    Follow Up Plan: CM Pharmacist will outreach to patient by telephone again within the next 30 days  Estelle Grumbles, PharmD, [facility] [phone]

## 2022-08-22 ENCOUNTER — Ambulatory Visit (INDEPENDENT_AMBULATORY_CARE_PROVIDER_SITE_OTHER): Payer: BC Managed Care – PPO | Admitting: Nurse Practitioner

## 2022-08-22 ENCOUNTER — Encounter: Payer: Self-pay | Admitting: Nurse Practitioner

## 2022-08-22 VITALS — BP 132/70 | HR 70 | Ht 67.0 in | Wt 181.1 lb

## 2022-08-22 DIAGNOSIS — I10 Essential (primary) hypertension: Secondary | ICD-10-CM | POA: Diagnosis not present

## 2022-08-22 DIAGNOSIS — E663 Overweight: Secondary | ICD-10-CM

## 2022-08-22 DIAGNOSIS — E785 Hyperlipidemia, unspecified: Secondary | ICD-10-CM | POA: Diagnosis not present

## 2022-08-22 NOTE — Progress Notes (Unsigned)
Established Patient Office Visit  Subjective:  Patient ID: Shawn Sparks, male    DOB: Jan 02, 1966  Age: 56 y.o. MRN: [id number]  CC:  Chief Complaint  Patient presents with   Hypertension    Patient here today for 1 months recheck of Blood pressure. Patient has been keeping up with it at home and readings have been good.      HPI  Shawn Sparks presents for 1 month follow up on blood pressure. His average blood pressure reading 125/80 at home. Patient states that he check his blood pressure before taking the medication. He also states that he feel sluggish after taking the medication.  He has lost 6 lb in 1 month.   HPI   Past Medical History:  Diagnosis Date   Hypertension     Past Surgical History:  Procedure Laterality Date   BRAIN SURGERY     as infant    Family History  Problem Relation Age of Onset   Hypertension Mother    Hyperlipidemia Mother    Diabetes Mother    Hypertension Father    Hyperlipidemia Father    Diabetes Father     Social History   Socioeconomic History   Marital status: Married    Spouse name: Not on file   Number of children: Not on file   Years of education: Not on file   Highest education level: Not on file  Occupational History   Not on file  Tobacco Use   Smoking status: Never   Smokeless tobacco: Never  Vaping Use   Vaping Use: Never used  Substance and Sexual Activity   Alcohol use: Yes    Comment: occasionaly   Drug use: Never   Sexual activity: Not Currently  Other Topics Concern   Not on file  Social History Narrative   Not on file   Social Determinants of Health   Financial Resource Strain: Not on file  Food Insecurity: Not on file  Transportation Needs: Not on file  Physical Activity: Not on file  Stress: Not on file  Social Connections: Not on file  Intimate Partner Violence: Not on file     Outpatient Medications Prior to Visit  Medication Sig Dispense Refill   lisinopril-hydrochlorothiazide  (ZESTORETIC) 10-12.5 MG tablet Take 1 tablet by mouth daily. 90 tablet 2   No facility-administered medications prior to visit.    Allergies  Allergen Reactions   Hydrocodone Other (See Comments)    hallucinations    ROS Review of Systems    Objective:    Physical Exam  BP 132/70   Pulse 70   Ht 5' 7"  (1.702 m)   Wt 181 lb 1.6 oz (82.1 kg)   BMI 28.36 kg/m  Wt Readings from Last 3 Encounters:  08/22/22 181 lb 1.6 oz (82.1 kg)  07/18/22 187 lb 12.8 oz (85.2 kg)  06/30/21 192 lb (87.1 kg)     Health Maintenance  Topic Date Due   COVID-19 Vaccine (1) Never done   HIV Screening  Never done   Hepatitis C Screening  Never done   TETANUS/TDAP  Never done   Zoster Vaccines- Shingrix (1 of 2) Never done   INFLUENZA VACCINE  Never done   COLONOSCOPY (Pts 45-86yr Insurance coverage will need to be confirmed)  07/19/2023 (Originally 05/23/2011)   HPV VACCINES  Aged Out    There are no preventive care reminders to display for this patient.  Lab Results  Component Value Date   TSH  2.460 07/19/2022   Lab Results  Component Value Date   WBC 3.5 07/19/2022   HGB 15.3 07/19/2022   HCT 47.3 07/19/2022   MCV 84 07/19/2022   PLT 182 07/19/2022   Lab Results  Component Value Date   NA 141 07/19/2022   K 4.7 07/19/2022   CO2 24 07/19/2022   GLUCOSE 92 07/19/2022   BUN 10 07/19/2022   CREATININE 1.33 (H) 07/19/2022   BILITOT 0.2 07/19/2022   ALKPHOS 58 07/19/2022   AST 17 07/19/2022   ALT 15 07/19/2022   PROT 7.1 07/19/2022   ALBUMIN 4.4 07/19/2022   CALCIUM 9.8 07/19/2022   ANIONGAP 7 06/30/2021   EGFR 63 07/19/2022   Lab Results  Component Value Date   CHOL 232 (H) 07/19/2022   Lab Results  Component Value Date   HDL 50 07/19/2022   Lab Results  Component Value Date   LDLCALC 162 (H) 07/19/2022   Lab Results  Component Value Date   TRIG 113 07/19/2022   No results found for: "CHOLHDL" No results found for: "HGBA1C"    Assessment & Plan:    Problem List Items Addressed This Visit   None    No orders of the defined types were placed in this encounter.    Follow-up: No follow-ups on file.    Theresia Lo, NP

## 2022-08-28 DIAGNOSIS — E785 Hyperlipidemia, unspecified: Secondary | ICD-10-CM | POA: Insufficient documentation

## 2022-08-28 NOTE — Assessment & Plan Note (Signed)
Patient BP 132/70 in the office today. Advised pt to follow a low sodium and heart healthy diet. Advised patient to check blood pressure at home and bring the readings during the next appointment. We will adjust the medication if needed.

## 2022-08-28 NOTE — Assessment & Plan Note (Signed)
Patient has elevated LDL level. Lowering the levels of LDL helps reduce the risk of cardiovascular diseases.  LDL can be lowered via lifestyle modification and pharmacological therapy.  Eat healthy diet, perform regular aerobic exercise and weight loss.

## 2022-08-28 NOTE — Assessment & Plan Note (Signed)
Body mass index is 28.36 kg/m. Advised pt to lose weight. Advised patient to avoid trans fat, fatty and fried food. Follow a regular physical activity schedule.

## 2022-08-29 ENCOUNTER — Other Ambulatory Visit: Payer: Self-pay | Admitting: Nurse Practitioner

## 2022-08-29 MED ORDER — LISINOPRIL-HYDROCHLOROTHIAZIDE 10-12.5 MG PO TABS: 0.5000 | ORAL_TABLET | Freq: Every day | ORAL | 2 refills | Status: AC

## 2022-08-29 NOTE — Progress Notes (Signed)
Patient states that he felt light headed after taking the BP medication and the BP lies between 110-120/65-70.  Advised him to take half lisinopril-HCTZ.

## 2022-12-04 DIAGNOSIS — M7051 Other bursitis of knee, right knee: Secondary | ICD-10-CM | POA: Diagnosis not present

## 2022-12-04 DIAGNOSIS — M25561 Pain in right knee: Secondary | ICD-10-CM | POA: Diagnosis not present
